# Patient Record
Sex: Male | Born: 2000 | Race: White | Hispanic: No | Marital: Single | Smoking: Never smoker
Health system: Southern US, Community
[De-identification: ages and names within clinical notes are randomized; demographics above are authoritative.]

## PROBLEM LIST (undated history)

## (undated) DIAGNOSIS — Z789 Other specified health status: Secondary | ICD-10-CM

## (undated) DIAGNOSIS — T7840XA Allergy, unspecified, initial encounter: Secondary | ICD-10-CM

## (undated) HISTORY — PX: ADENOIDECTOMY: SUR15

## (undated) HISTORY — DX: Allergy, unspecified, initial encounter: T78.40XA

## (undated) HISTORY — PX: TONSILLECTOMY: SUR1361

---
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
## 2019-09-15 DIAGNOSIS — Z03818 Encounter for observation for suspected exposure to other biological agents ruled out: Secondary | ICD-10-CM | POA: Diagnosis not present

## 2019-09-15 DIAGNOSIS — Z20822 Contact with and (suspected) exposure to covid-19: Secondary | ICD-10-CM | POA: Diagnosis not present

## 2019-10-06 DIAGNOSIS — Z20822 Contact with and (suspected) exposure to covid-19: Secondary | ICD-10-CM | POA: Diagnosis not present

## 2019-10-20 DIAGNOSIS — Z03818 Encounter for observation for suspected exposure to other biological agents ruled out: Secondary | ICD-10-CM | POA: Diagnosis not present

## 2019-10-20 DIAGNOSIS — Z20822 Contact with and (suspected) exposure to covid-19: Secondary | ICD-10-CM | POA: Diagnosis not present

## 2020-04-18 DIAGNOSIS — M79645 Pain in left finger(s): Secondary | ICD-10-CM | POA: Diagnosis not present

## 2020-05-16 DIAGNOSIS — M79645 Pain in left finger(s): Secondary | ICD-10-CM | POA: Diagnosis not present
